# Patient Record
Sex: Female | Born: 1965 | Race: White | Hispanic: No | Marital: Married
Health system: Midwestern US, Community
[De-identification: ages and names within clinical notes are randomized; demographics above are authoritative.]

---

## 2014-01-15 ENCOUNTER — Inpatient Hospital Stay
Admit: 2014-01-15 | Discharge: 2014-01-15 | Disposition: A | Discharge status: Home / Self Care | Attending: Emergency Medicine

## 2014-01-15 LAB — CBC WITH AUTO DIFFERENTIAL
Basophils %: 0.8
Basophils Absolute: 0.1 (ref 0.0–0.1)
Eosinophils %: 2.1
Eosinophils Absolute: 0.3 (ref 0.0–0.5)
Hematocrit: 37.6 (ref 35–47)
Hemoglobin: 12.1 (ref 11.7–16.0)
Lymphocytes %: 24.6
Lymphocytes Absolute: 3 (ref 1.0–4.3)
MCH: 27.9 (ref 26–34)
MCHC: 32.2 (ref 32–36)
MCV: 86.6 (ref 79–98)
MPV: 8 (ref 7.4–10.4)
Monocytes %: 8.9
Monocytes Absolute: 1.1 — ABNORMAL HIGH (ref 0.0–0.8)
Neutrophils Absolute: 7.7 — ABNORMAL HIGH (ref 1.8–7.0)
Platelets: 287 (ref 140–440)
RBC: 4.34 (ref 3.8–5.2)
RDW: 13.9 (ref 11.5–14.5)
Segs Relative: 63.6
WBC: 12.1 — ABNORMAL HIGH (ref 3.6–10.7)

## 2014-01-15 LAB — BASIC METABOLIC PANEL
Anion Gap: 7 (ref 6–16)
BUN/Creatinine Ratio: 18.1 (ref 6.0–20.0)
BUN: 15 (ref 7–25)
CO2: 27 (ref 21–31)
Calcium: 8.6 (ref 8.2–10.5)
Chloride: 108 (ref 98–109)
Creatinine: 0.83 (ref 0.60–1.50)
GFR African American: >60
GFR Non-African American: >60
Glucose: 108 — ABNORMAL HIGH (ref 70–100)
Potassium: 3.9 (ref 3.5–5.0)
Sodium: 142 (ref 135–145)

## 2015-06-30 ENCOUNTER — Inpatient Hospital Stay: Admit: 2015-06-30 | Discharge: 2015-06-30 | Disposition: A | Discharge status: Home / Self Care

## 2015-06-30 DIAGNOSIS — L03211 Cellulitis of face: Secondary | ICD-10-CM

## 2015-06-30 MED ORDER — CETIRIZINE HCL 10 MG PO TABS
10 MG | ORAL_TABLET | Freq: Every day | ORAL | 0 refills | Status: AC
Start: 2015-06-30 — End: 2015-07-10

## 2015-06-30 MED ORDER — SULFAMETHOXAZOLE-TRIMETHOPRIM 800-160 MG PO TABS
800-160 MG | Freq: Once | ORAL | Status: AC
Start: 2015-06-30 — End: 2015-06-30
  Administered 2015-06-30: 13:00:00 1 via ORAL

## 2015-06-30 MED ORDER — SULFAMETHOXAZOLE-TRIMETHOPRIM 800-160 MG PO TABS
800-160 MG | ORAL_TABLET | Freq: Two times a day (BID) | ORAL | 0 refills | Status: AC
Start: 2015-06-30 — End: 2015-07-10

## 2015-06-30 MED ORDER — AMOXICILLIN-POT CLAVULANATE 875-125 MG PO TABS
875-125 MG | Freq: Once | ORAL | Status: DC
Start: 2015-06-30 — End: 2015-06-30

## 2015-06-30 MED ORDER — TETANUS-DIPHTH-ACELL PERTUSSIS 5-2.5-18.5 LF-MCG/0.5 IM SUSP
Freq: Once | INTRAMUSCULAR | Status: DC
Start: 2015-06-30 — End: 2015-06-30

## 2015-06-30 MED ORDER — CETIRIZINE HCL 10 MG PO TABS
10 MG | Freq: Every day | ORAL | Status: DC
Start: 2015-06-30 — End: 2015-06-30
  Administered 2015-06-30: 13:00:00 10 mg via ORAL

## 2015-06-30 MED FILL — CETIRIZINE HCL 10 MG PO TABS: 10 MG | ORAL | Qty: 1

## 2015-06-30 MED FILL — SULFAMETHOXAZOLE-TRIMETHOPRIM 800-160 MG PO TABS: 800-160 MG | ORAL | Qty: 1

## 2015-06-30 NOTE — Other (Unsigned)
Patient Acct Nbr: [id number]   Primary AUTH/CERT:   Primary Insurance Company Name: [employer]  Primary Insurance Plan name: [employer]  Primary Insurance Group Number:   Primary Insurance Plan Type: Health  Primary Insurance Policy Number: [id number]

## 2015-06-30 NOTE — ED Provider Notes (Signed)
[facility] ED  eMERGENCY dEPARTMENT eNCOUnter      Pt Name: Haley Barker  MRN: [id number]  Birthdate 05-09-1966  Date of evaluation: 06/30/2015  Provider: Donah Driver, MD     CHIEF COMPLAINT       Chief Complaint   Patient presents with   ??? Eye Pain     reness swelling right lower periorbital area  no trauma  since yesterday    denies vision changes   wears contacts         HISTORY OF PRESENT ILLNESS   (Location/Symptom, Timing/Onset, Context/Setting, Quality, Duration, Modifying Factors, Severity) Note limiting factors.   HPI    Haley Barker is a 50 y.o. female who presents to the emergency department. He described about a 2 day history of RIGHT infraorbital swelling and warmth tenderness slight drainage of the conjunctiva. The patient has no vision disturbances patient has no other problems or complaints no headache chills fevers she does mention no she has had a little bit of irritation of her RIGHT external canal.    Nursing Notes were reviewed.    REVIEW OF SYSTEMS    (2+ for level 4; 10+ for level 5)     Review of Systems   Constitutional: Negative for activity change, appetite change, chills, fatigue and fever.   HENT: Negative for drooling, ear discharge, mouth sores, nosebleeds, tinnitus and voice change.    Eyes: Negative for photophobia, pain and visual disturbance.   Respiratory: Negative for cough, chest tightness, shortness of breath and wheezing.    Cardiovascular: Negative for chest pain and leg swelling.   Gastrointestinal: Negative for abdominal distention, abdominal pain, blood in stool, diarrhea, rectal pain and vomiting.   Endocrine: Negative for polydipsia, polyphagia and polyuria.   Genitourinary: Negative for dysuria, flank pain, hematuria and urgency.   Musculoskeletal: Negative for arthralgias, gait problem and neck pain.   Skin: Negative for pallor and rash.   Neurological: Negative for dizziness, syncope, facial asymmetry, speech difficulty, weakness and numbness.    Hematological: Negative for adenopathy.   Psychiatric/Behavioral: Negative for agitation, confusion, hallucinations, self-injury and suicidal ideas.       PAST MEDICAL HISTORY   No past medical history on file.    SURGICAL HISTORY     No past surgical history on file.    CURRENT MEDICATIONS       Previous Medications    No medications on file       ALLERGIES     Review of patient's allergies indicates no known allergies.    FAMILY HISTORY     No family history on file.       SOCIAL HISTORY       Social History     Social History   ??? Marital status: Married     Spouse name: N/A   ??? Number of children: N/A   ??? Years of education: N/A     Social History Main Topics   ??? Smoking status: Not on file   ??? Smokeless tobacco: Not on file   ??? Alcohol use Not on file   ??? Drug use: Not on file   ??? Sexual activity: Not on file     Other Topics Concern   ??? Not on file     Social History Narrative       SCREENINGS           PHYSICAL EXAM    (5+ for level 4, 8+ for level 5)   ED Triage  Vitals   BP Temp Temp src Pulse Resp SpO2 Height Weight   -- -- -- -- -- -- -- --                 Physical Exam   Constitutional: She is oriented to person, place, and time. She appears well-developed and well-nourished.   HENT:   Head: Normocephalic and atraumatic.   Right Ear: External ear normal.   Left Ear: External ear normal.   Nose: Nose normal.   Mouth/Throat: Oropharynx is clear and moist.   The RIGHT external canal has slight erythema in the canal the tympanic membrane is normal color and position is no drainage there is pain with palpating the tragus.       Eyes: Conjunctivae and EOM are normal. Pupils are equal, round, and reactive to light.   The RIGHT infraorbital area is slightly edematous tender has consistency and appearance of that of mild RIGHT infraorbital cellulitis. The conjunctivae is beefy red is a little bit of drainage of the medial canthus otherwise extraocular muscles intact visual acuity is 20/20 in both eyes there is no  vision disturbance no posterior chamber changes   Neck: Normal range of motion. Neck supple. No tracheal deviation present. No thyromegaly present.   Cardiovascular: Normal rate, regular rhythm, normal heart sounds and intact distal pulses.  Exam reveals no gallop.    Pulmonary/Chest: Effort normal and breath sounds normal. No respiratory distress. She has no wheezes. She has no rales. She exhibits no tenderness.   Abdominal: Soft. Bowel sounds are normal. She exhibits no distension. There is no tenderness. There is no rebound and no guarding.   Musculoskeletal: She exhibits no edema, tenderness or deformity.   Lymphadenopathy:     She has no cervical adenopathy.   Neurological: She is alert and oriented to person, place, and time. She displays normal reflexes. No cranial nerve deficit. She exhibits normal muscle tone. Coordination normal.   Skin: Skin is warm and dry. No rash noted. No erythema.   Psychiatric: She has a normal mood and affect. Her behavior is normal. Judgment and thought content normal.   Nursing note and vitals reviewed.      DIAGNOSTIC RESULTS     EKG (Per Emergency Physician)     RADIOLOGY (Per Emergency Physician):    Interpretation per the Radiologist below, if available at the time of this note:  No results found.    LABS:  Labs Reviewed - No data to display    All other labs were within normal range or not returned as of this dictation.    EMERGENCY DEPARTMENT COURSE and DIFFERENTIAL DIAGNOSIS/MDM:   Vitals:  There were no vitals filed for this visit.    Medications   cetirizine (ZYRTEC) tablet 10 mg (not administered)   amoxicillin-clavulanate (AUGMENTIN) 875-125 MG per tablet 1 tablet (not administered)       MDM.  The patient was seen in triage, vitals were taken, nurse's notes are reviewed. If the patient arrived via EMS I interviewed the squad obtain pertinent history from caregivers, EMS, additional information for people at the bedside and with the patient.    After completing a  history and physical examination, a review of systems,  I generated differential diagnosis.    I have limited testing to evaluate the patient    In addition, patient appears to have an focal cellulitis slight irritation she has a little bit of what she describes itching it is possible this represents a mild ALLERGIC  conjunctivitis but I opted to place her on antibiotics cleared and she was discharged in stable condition follow-up ophthalmology next 1-3 days.           CONSULTS:  None    PROCEDURES:  Unless otherwise noted below, none     Procedures  None  FINAL IMPRESSION    No diagnosis found.      DISPOSITION/PLAN   DISPOSITION     PATIENT REFERRED TO:  No follow-up provider specified.    DISCHARGE MEDICATIONS:  New Prescriptions    No medications on file          (Please note:  Portions of this note were completed with a voice recognition program.  Efforts were made to edit the dictations but occasionally words and phrases are mis-transcribed.)    Form [id number].[id number]    Donah Driver, MD (electronically signed)  Emergency Medicine Provider             Donah Driver, MD  06/30/15 [phone]

## 2015-08-01 NOTE — Other (Unsigned)
Patient Acct Nbr: [id number]   Primary AUTH/CERT: ""  Primary Insurance Company Name: [employer]  Primary Insurance Plan name: [facility]  Primary Insurance Group Number:   Primary Insurance Plan Type: Health  Primary Insurance Policy Number: [id number]

## 2015-08-06 ENCOUNTER — Inpatient Hospital Stay: Attending: Family Medicine | Primary: Family Medicine

## 2015-10-04 NOTE — Other (Unsigned)
Patient Acct Nbr: [id number]   Primary AUTH/CERT:   Primary Insurance Company Name: [employer] [facility]  Primary Insurance Plan name: [employer]  Primary Insurance Group Number: [id number]  Primary Insurance Plan Type: Health  Primary Insurance Policy Number: [id number]

## 2015-12-04 ENCOUNTER — Inpatient Hospital Stay: Admit: 2015-12-04 | Attending: Family Medicine | Primary: Family Medicine

## 2015-12-04 NOTE — Other (Unsigned)
Patient Acct Nbr: [id number]   Primary AUTH/CERT:   Primary Insurance Company Name: [employer] [facility]  Primary Insurance Plan name: [employer]  Primary Insurance Group Number: [id number]  Primary Insurance Plan Type: Health  Primary Insurance Policy Number: [id number]

## 2015-12-13 ENCOUNTER — Inpatient Hospital Stay: Admit: 2015-12-13 | Attending: Family Medicine | Primary: Family Medicine

## 2015-12-13 NOTE — Other (Unsigned)
Patient Acct Nbr: [id number]   Primary AUTH/CERT:   Primary Insurance Company Name: [employer] [facility]  Primary Insurance Plan name: [employer]  Primary Insurance Group Number: [id number]  Primary Insurance Plan Type: Health  Primary Insurance Policy Number: [id number]

## 2016-01-13 ENCOUNTER — Inpatient Hospital Stay: Admit: 2016-01-13 | Attending: Family Medicine | Primary: Family Medicine

## 2016-01-13 NOTE — Other (Signed)
Haley Barker:                Mini, Corynne D  MEDICAL RECORD #:       [id number]  DATE OF SERVICE:        01/13/2016  ACCOUNT #:              [id number]  ORDER NUMBER:  DATE OF BIRTH:          Feb 19, 1966  AGE:                    49  ADMITTING PHYSICIAN:    Gerrianne ScaleLaura Distel, MD  ATTENDING PHYSICIAN:    Gerrianne ScaleLaura Distel, MD  DICTATING PHYSICIAN:    Milus MallickNorman Lorie Cleckley, MD                                  NEUROLOGY    RIGHT UPPER EXTREMITY NERVE CONDUCTION STUDY ONLY    INTRODUCTION: This is a nerve conduction study of the right upper  extremity performed on this  50 year old female with paresthesias in the right upper extremity for  5 months.    Median motor distal latency is prolonged with preservation of  amplitude and mildly slow conduction  velocity.  The ulnar motor and sensory responses are normal and the  radial sensory response is normal.    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          DOD:01/13/2016 04:13 P  NF/dsk  DOT:01/13/2016 06:19 P  Job Number:  Document Number: [id number]  cc: Gerrianne ScaleLaura Distel, MD      [facility]      [address] [place]      [place] [id number]

## 2016-01-13 NOTE — Other (Unsigned)
Patient Acct Nbr: [id number]   Primary AUTH/CERT:   Primary Insurance Company Name: [employer] [facility]  Primary Insurance Plan name: [employer]  Primary Insurance Group Number: [id number]  Primary Insurance Plan Type: Health  Primary Insurance Policy Number: [id number]

## 2016-02-06 NOTE — Telephone Encounter (Signed)
Name of Caller: joyce     Contact phone number: [phone]    Relationship to Patient: pharmacy     Chief Complaint/Reason for Call: the pharmacy called and asked if there was another medicaition that you could call in for the patient due to the cost of the cortisporne tc.  Also the pharmacy said that you would need 2 bottles to last the 7 days     Best time of day caller can be reached: Any     Did this call require a physician to be paged?: No    If Yes who did you page?     Patient advised that office/PCP has 24-48 business hours to return their call: Yes

## 2016-02-19 ENCOUNTER — Encounter: Attending: Orthopaedic Surgery | Primary: Family Medicine

## 2016-06-22 ENCOUNTER — Encounter: Primary: Family Medicine

## 2016-12-16 NOTE — Other (Unsigned)
Patient Acct Nbr: [id number]   Primary AUTH/CERT:   Primary Insurance Company Name: [employer]  Primary Insurance Plan name: [facility]  Primary Insurance Group Number:   Primary Insurance Plan Type: Health  Primary Insurance Policy Number: [id number]

## 2016-12-17 LAB — GASTROINTESTINAL PANEL, MOLECULAR: Gastrointestinal PCR Panel: NEGATIVE

## 2016-12-17 LAB — C. DIFFICILE TOXIN MOLECULAR: CLOSTRIDIUM DIFFICILE: NEGATIVE

## 2017-08-20 LAB — BASIC METABOLIC PANEL
Anion Gap: 10 NA
BUN: 15 mg/dL (ref 7–20)
CO2: 27 mmol/L (ref 22–30)
Calcium: 9.1 mg/dL (ref 8.4–10.4)
Chloride: 106 mmol/L (ref 98–107)
Creatinine: 0.82 mg/dL (ref 0.52–1.25)
EGFR IF NonAfrican American: >60 mL/min (ref >60)
Glucose: 91 mg/dL (ref 70–100)
Potassium: 3.7 mmol/L (ref 3.5–5.1)
Sodium: 142 mmol/L (ref 135–145)
eGFR African American: >60 mL/min (ref >60)

## 2017-08-20 NOTE — Other (Unsigned)
Patient Acct Nbr: [id number]   Primary AUTH/CERT:   Primary Insurance Company Name: [employer]  Primary Insurance Plan name: [facility]  Primary Insurance Group Number:   Primary Insurance Plan Type: Health  Primary Insurance Policy Number: [id number]

## 2017-08-23 NOTE — Other (Unsigned)
Patient Acct Nbr: [id number]   Primary AUTH/CERT:   Primary Insurance Company Name: [employer]  Primary Insurance Plan name: [facility]  Primary Insurance Group Number:   Primary Insurance Plan Type: Health  Primary Insurance Policy Number: [id number]

## 2017-08-24 ENCOUNTER — Inpatient Hospital Stay: Attending: Family Medicine | Primary: Family Medicine

## 2017-09-03 ENCOUNTER — Inpatient Hospital Stay: Admit: 2017-09-03 | Attending: Family Medicine | Primary: Family Medicine

## 2017-09-03 MED ORDER — IOPAMIDOL 76 % IV SOLN
76 % | Freq: Once | INTRAVENOUS | Status: AC | PRN
Start: 2017-09-03 — End: 2017-09-03
  Administered 2017-09-03: 15:00:00 75 mL via INTRAVENOUS

## 2017-09-03 NOTE — Other (Unsigned)
Patient Acct Nbr: [id number]   Primary AUTH/CERT: [id number]  Primary Insurance Company Name: [employer]  Primary Insurance Plan name: [facility]  Primary Insurance Group Number:   Primary Insurance Plan Type: Health  Primary Insurance Policy Number: [id number]

## 2023-05-14 IMAGING — MR MRI BRAIN W/O CONTRAST
6 series · 48 of 48 positions shown · IV contrast (Off)
Comparison: Examination compared to prior examination from our facility dated 03/27/2021.

MRI OF THE BRAIN WITHOUT CONTRAST
CLINICAL HISTORY: Follow-up previous oligodendroglioma.
TECHNIQUE: Multisequential multiplanar imaging was performed of the brain, including diffusion-weighted imaging.

[Series 2: T1 · sagittal · 5.0mm · 0.90mm/px · 7 of 21 slices shown (1 of 2)]
[im 1/21]
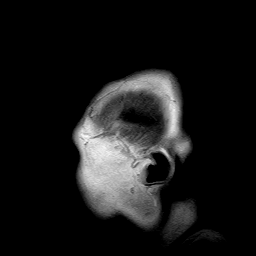
[im 4/21]
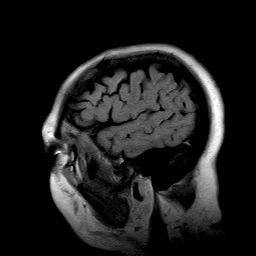
[im 7/21]
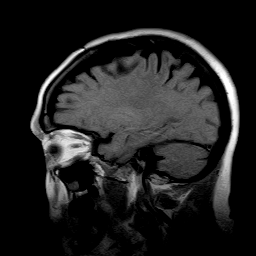
[im 11/21]
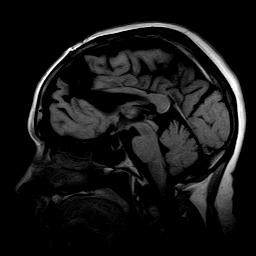
[im 14/21]
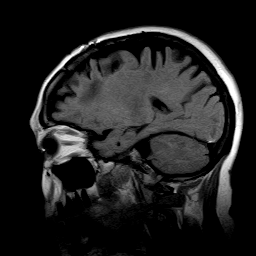
[im 17/21]
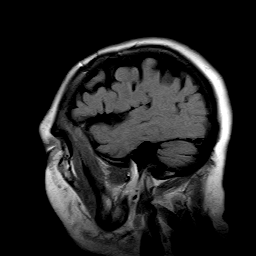
[im 21/21]
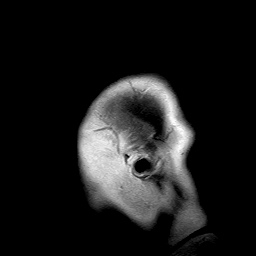

[Series 8: DWI · axial · 6.0mm · 1.88mm/px · z∈[-91,+41]mm · 7 of 18 slices shown]
[im 1/18]
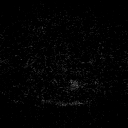
[im 3/18]
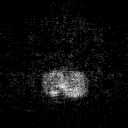
[im 6/18]
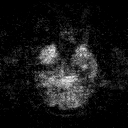
[im 9/18]
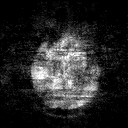
[im 12/18]
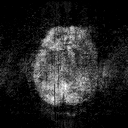
[im 15/18]
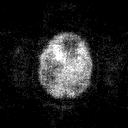
[im 18/18]
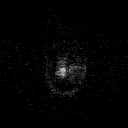

[Series 9: ADC · axial · 6.0mm · 1.88mm/px · z∈[-91,+41]mm · 7 of 18 slices shown]
[im 1/18]
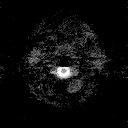
[im 3/18]
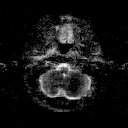
[im 6/18]
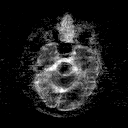
[im 9/18]
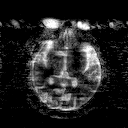
[im 12/18]
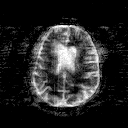
[im 15/18]
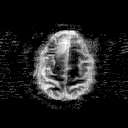
[im 18/18]
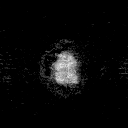

[Series 10: T2 · axial · 5.0mm · 0.94mm/px · z∈[-85,+61]mm · 9 of 22 slices shown]
[im 1/22]
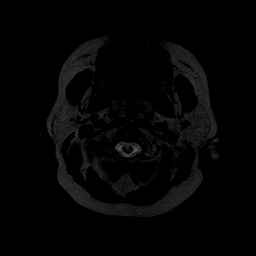
[im 3/22]
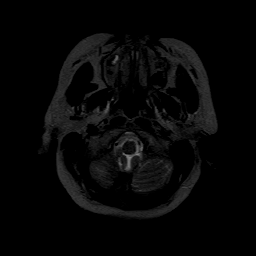
[im 6/22]
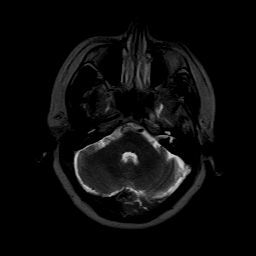
[im 8/22]
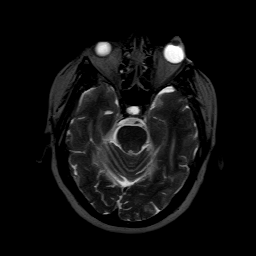
[im 11/22]
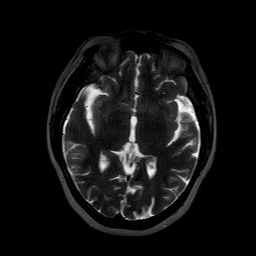
[im 14/22]
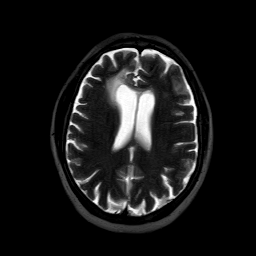
[im 16/22]
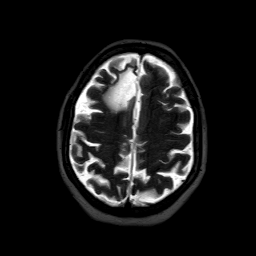
[im 19/22]
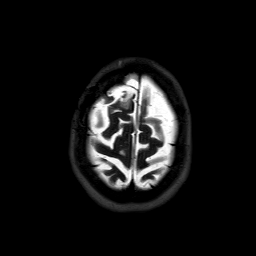
[im 22/22]
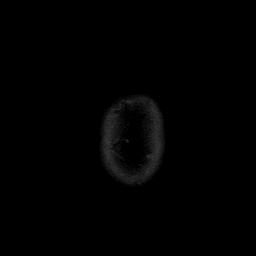

[Series 11: T1 · axial · 5.0mm · 0.94mm/px · z∈[-85,+61]mm · 9 of 22 slices shown (2 of 2)]
[im 1/22]
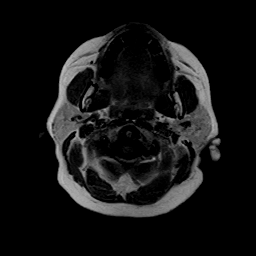
[im 3/22]
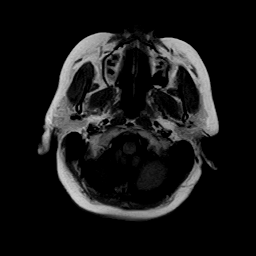
[im 6/22]
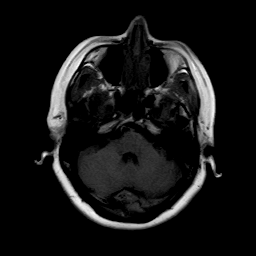
[im 8/22]
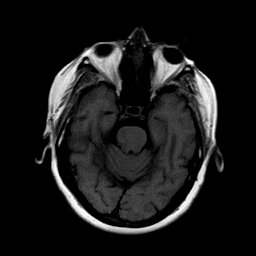
[im 11/22]
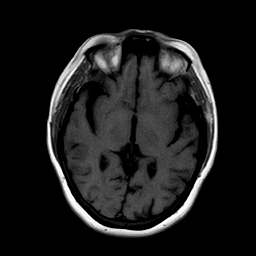
[im 14/22]
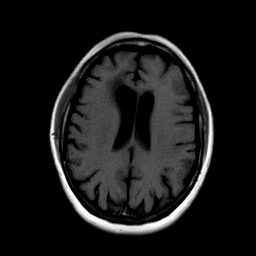
[im 16/22]
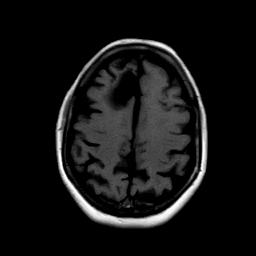
[im 19/22]
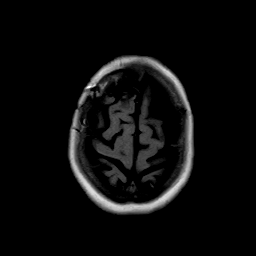
[im 22/22]
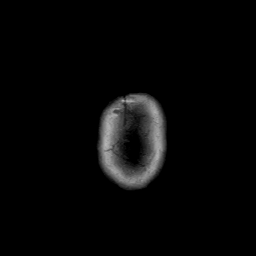

[Series 12: FLAIR · axial · 5.0mm · 0.94mm/px · z∈[-85,+61]mm · 9 of 22 slices shown]
[im 1/22]
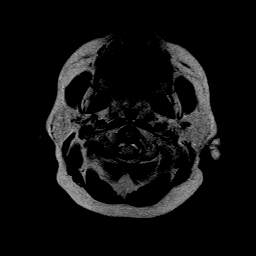
[im 3/22]
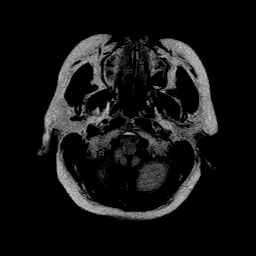
[im 6/22]
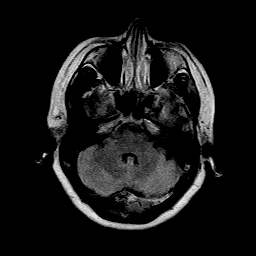
[im 8/22]
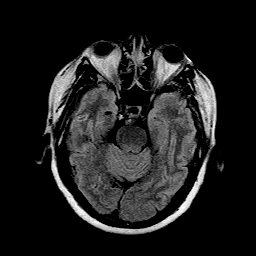
[im 11/22]
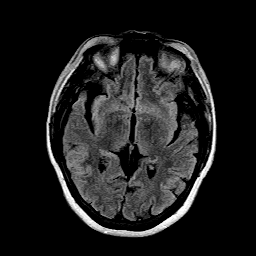
[im 14/22]
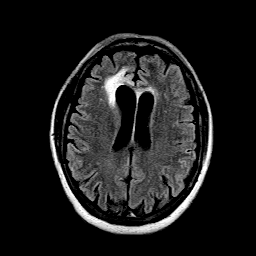
[im 16/22]
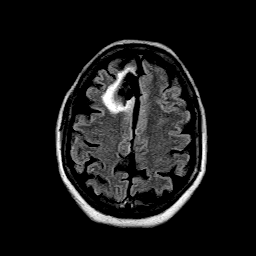
[im 19/22]
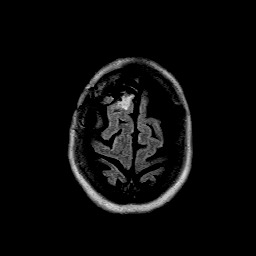
[im 22/22]
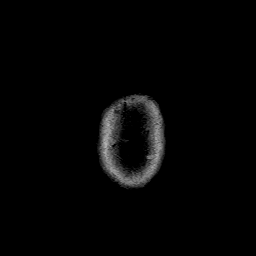

[48 of 48 positions shown; findings below may reference images not displayed]

Prior examination was a with and without contrast examination whereas the current examination is a without contrast examination.
FINDINGS: There is no MRI evidence of hemorrhage, mass effect, midline shift, extra-axial fluid collections, or hydrocephalus. There is evidence of significant encephalomalacia and gliosis in the right frontal region, which appears to be post-treatment in nature. This appears stable. There is no evidence of restricted diffusion. Evidence of some involutional change.
IMPRESSION: 1. Early involutional change. 

2. Area of post-surgical post-treatment encephalomalacia and gliosis in the right frontal region. We cannot evaluate for an enhancing lesion since contrast was not utilized, however, there is no obvious change in size, shape, or configuration since prior examination. 

3. No evidence of acute or subacute ischemia or other cause of restricted diffusion.
